# Patient Record
Sex: Male | Born: 2000 | Hispanic: No | Marital: Single | Smoking: Never smoker
Health system: Southern US, Community
[De-identification: ages and names within clinical notes are randomized; demographics above are authoritative.]

## PROBLEM LIST (undated history)

## (undated) DIAGNOSIS — J45909 Unspecified asthma, uncomplicated: Secondary | ICD-10-CM

---
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
## 2020-10-09 ENCOUNTER — Encounter (HOSPITAL_BASED_OUTPATIENT_CLINIC_OR_DEPARTMENT_OTHER): Payer: Self-pay

## 2020-10-09 ENCOUNTER — Emergency Department (HOSPITAL_BASED_OUTPATIENT_CLINIC_OR_DEPARTMENT_OTHER): Payer: Self-pay | Admitting: Radiology

## 2020-10-09 ENCOUNTER — Emergency Department (HOSPITAL_BASED_OUTPATIENT_CLINIC_OR_DEPARTMENT_OTHER)
Admission: EM | Admit: 2020-10-09 | Discharge: 2020-10-09 | Disposition: A | Discharge status: Home / Self Care | Payer: Self-pay | Attending: Emergency Medicine | Admitting: Emergency Medicine

## 2020-10-09 ENCOUNTER — Other Ambulatory Visit: Payer: Self-pay

## 2020-10-09 DIAGNOSIS — M79652 Pain in left thigh: Secondary | ICD-10-CM | POA: Insufficient documentation

## 2020-10-09 DIAGNOSIS — M545 Low back pain, unspecified: Secondary | ICD-10-CM | POA: Insufficient documentation

## 2020-10-09 DIAGNOSIS — M542 Cervicalgia: Secondary | ICD-10-CM | POA: Insufficient documentation

## 2020-10-09 DIAGNOSIS — Y92410 Unspecified street and highway as the place of occurrence of the external cause: Secondary | ICD-10-CM | POA: Insufficient documentation

## 2020-10-09 DIAGNOSIS — J45909 Unspecified asthma, uncomplicated: Secondary | ICD-10-CM | POA: Insufficient documentation

## 2020-10-09 DIAGNOSIS — R0781 Pleurodynia: Secondary | ICD-10-CM | POA: Insufficient documentation

## 2020-10-09 MED ORDER — CYCLOBENZAPRINE HCL 5 MG PO TABS: 5.0000 mg | ORAL_TABLET | Freq: Three times a day (TID) | ORAL | 0 refills | Status: AC | PRN

## 2020-10-09 MED ORDER — CYCLOBENZAPRINE HCL 5 MG PO TABS: 5.0000 mg | ORAL_TABLET | Freq: Three times a day (TID) | ORAL | 0 refills | Status: DC | PRN

## 2020-10-09 NOTE — Discharge Instructions (Addendum)
You have been seen and discharged from the emergency department.  Your x-rays were negative for any fracture.  Take Tylenol and ibuprofen as needed for pain control.  Take muscle relaxer as needed.  Do not mix this medication with alcohol or other sedating medications. Do not drive or do heavy physical activity and to know how this medication affects you.  It may cause drowsiness.  Stay well-hydrated.  Follow-up with your primary provider for reevaluation and further care. Take home medications as prescribed. If you have any worsening symptoms or further concerns for your health please return to an emergency department for further evaluation.
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
## 2022-09-19 IMAGING — DX DG RIBS W/ CHEST 3+V*L*
3 series · 3 of 3 positions shown · non-contrast
Comparison: 03/12/2015

CLINICAL DATA: Left-sided chest pain, initial encounter

EXAM:
LEFT RIBS AND CHEST - 3+ VIEW

[chest pa]
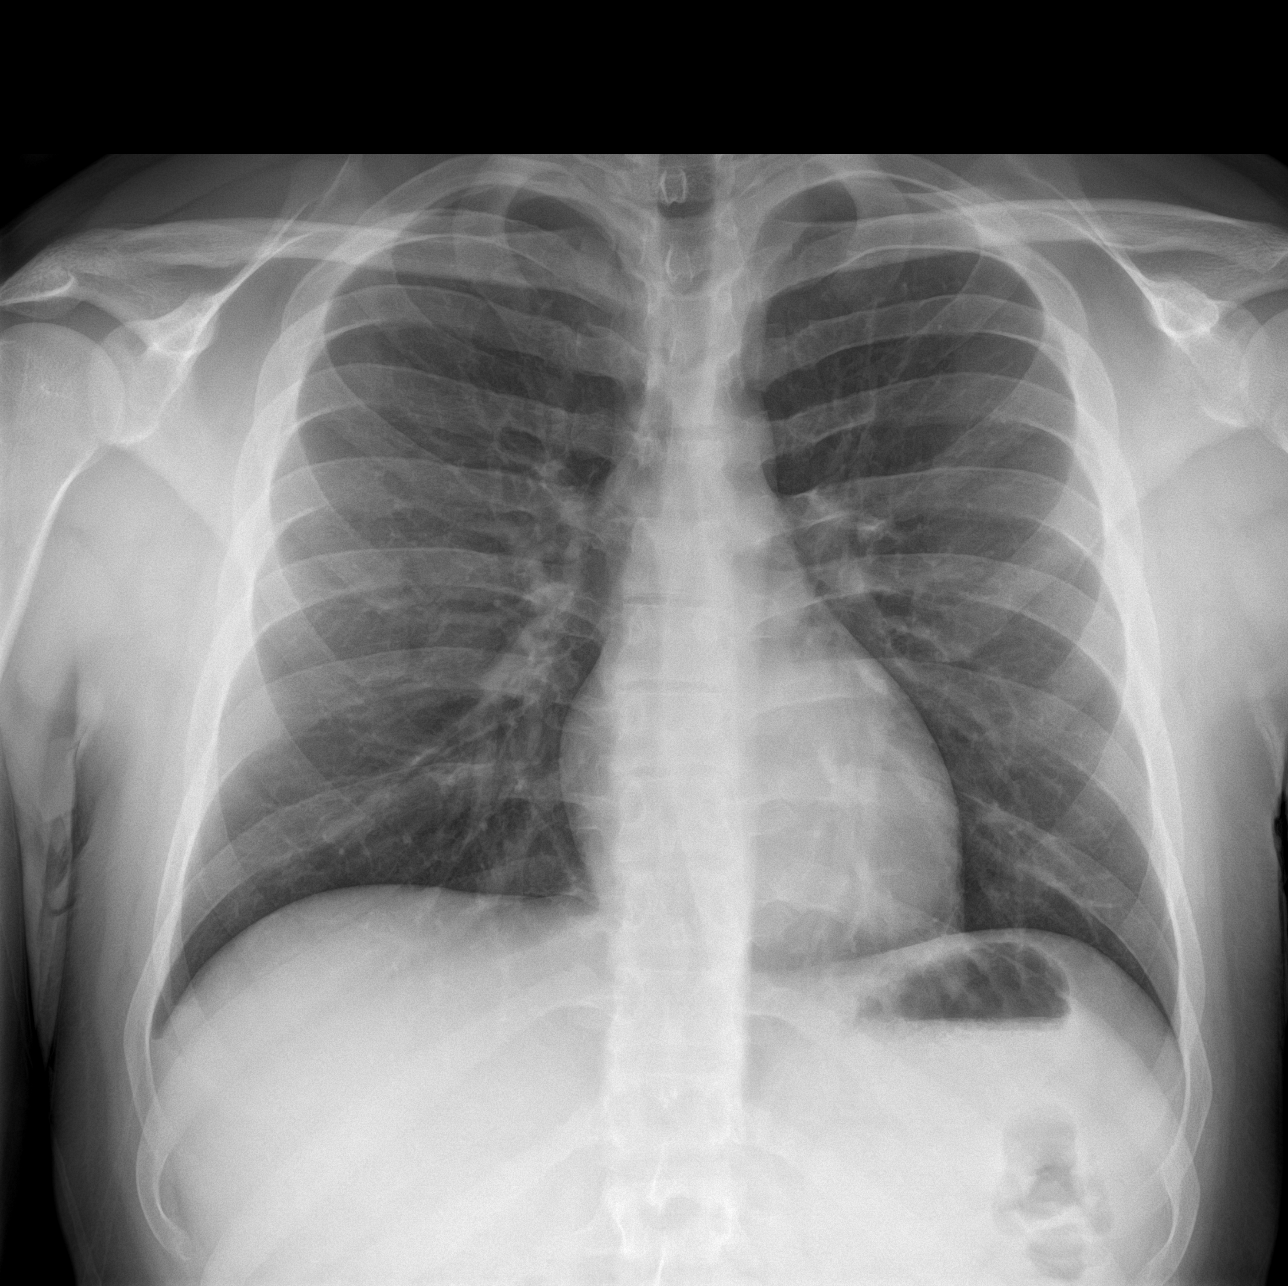

[rib ap]
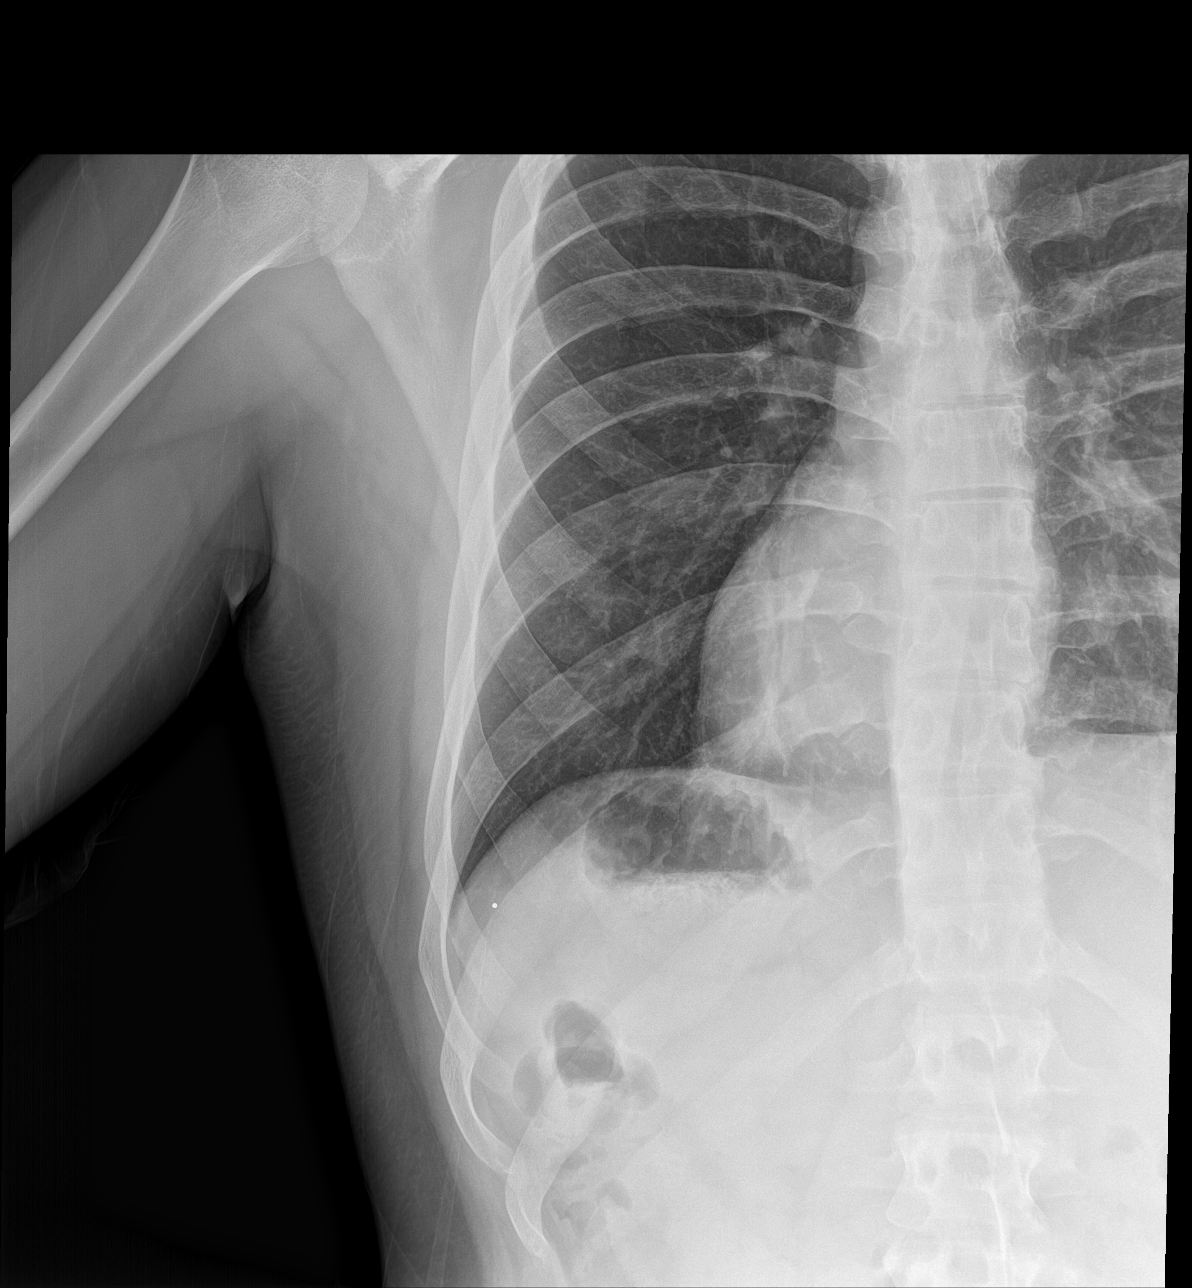

[rib obl]
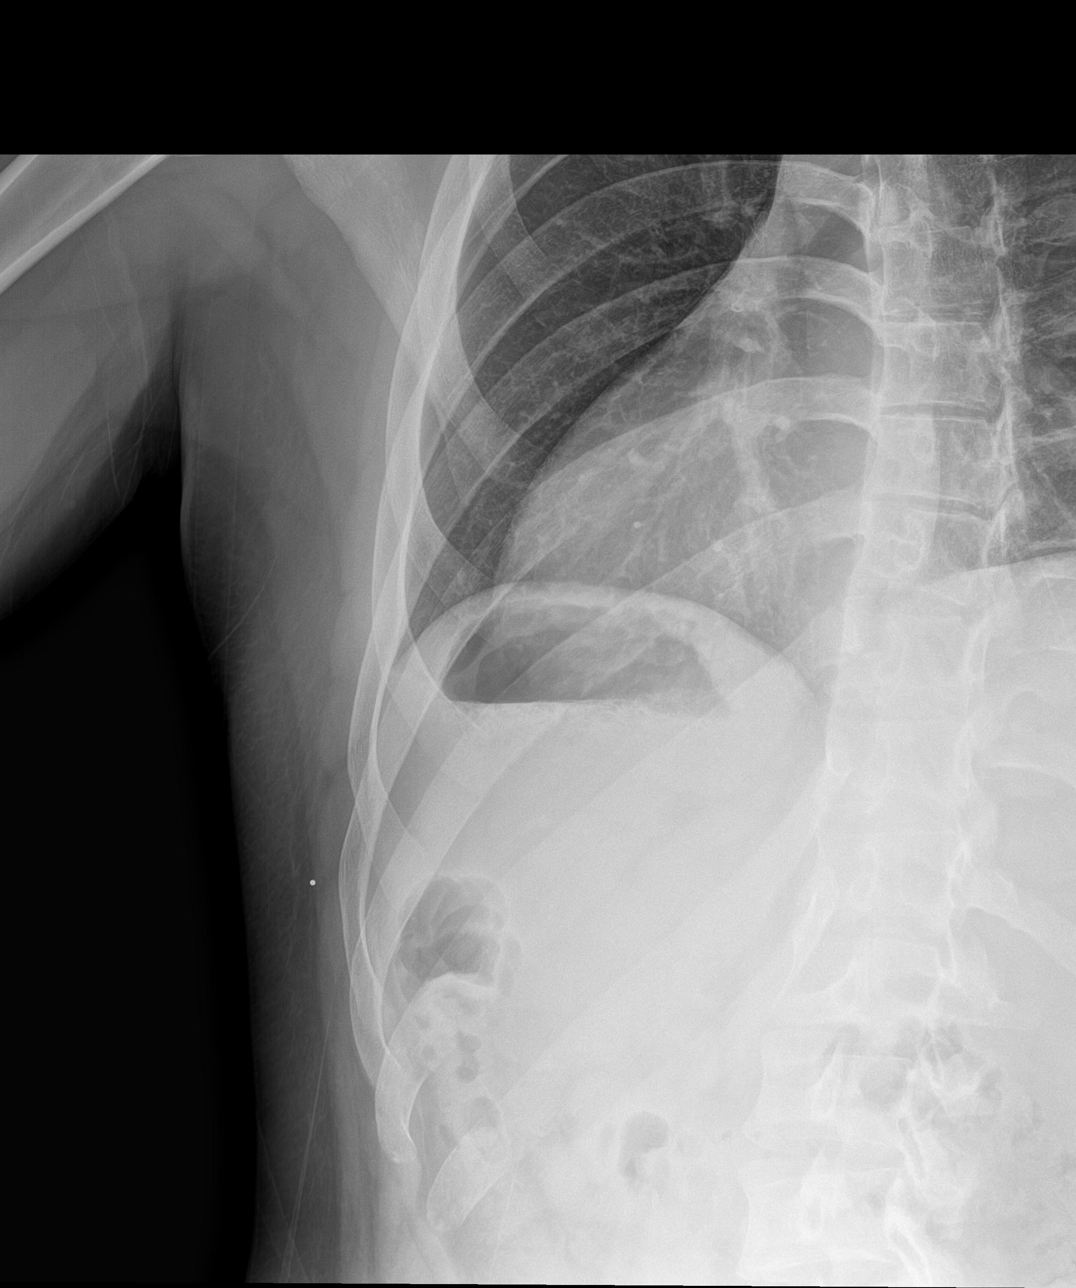

[3 of 3 positions shown; findings below may reference images not displayed]

FINDINGS: Cardiac shadow is within normal limits. Lungs are well aerated
bilaterally. No focal infiltrate or sizable effusion is seen. No
acute rib abnormality is noted.
IMPRESSION: No acute rib abnormality noted.
